# Patient Record
Sex: Male | Born: 2000
Health system: Southern US, Community
[De-identification: ages and names within clinical notes are randomized; demographics above are authoritative.]

## PROBLEM LIST (undated history)

## (undated) HISTORY — PX: OTHER SURGICAL HISTORY: SHX169

## (undated) HISTORY — PX: TONSILLECTOMY: SUR1361

---
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
## 2020-07-17 ENCOUNTER — Other Ambulatory Visit: Payer: Self-pay

## 2020-07-17 ENCOUNTER — Emergency Department
Admission: EM | Admit: 2020-07-17 | Discharge: 2020-07-17 | Disposition: A | Discharge status: Home / Self Care | Payer: No Typology Code available for payment source | Attending: Emergency Medicine | Admitting: Emergency Medicine

## 2020-07-17 DIAGNOSIS — H9202 Otalgia, left ear: Secondary | ICD-10-CM | POA: Diagnosis present

## 2020-07-17 DIAGNOSIS — H66002 Acute suppurative otitis media without spontaneous rupture of ear drum, left ear: Secondary | ICD-10-CM | POA: Insufficient documentation

## 2020-07-17 MED ORDER — AMOXICILLIN 500 MG PO CAPS
1000.0000 mg | ORAL_CAPSULE | Freq: Once | ORAL | Status: AC
Start: 2020-07-17 — End: 2020-07-17
  Administered 2020-07-17: 1000 mg via ORAL
  Filled 2020-07-17: qty 2

## 2020-07-17 MED ORDER — AMOXICILLIN 875 MG PO TABS
875.0000 mg | ORAL_TABLET | Freq: Two times a day (BID) | ORAL | 0 refills | Status: AC
Start: 2020-07-17 — End: 2020-07-27

## 2020-07-17 NOTE — ED Triage Notes (Signed)
Pt states this week has had cold symptoms. Pt states approx 2 hours pta began to experience left sided ear pain. Pt describes as "inside my head you know". Pt appears in no acute distress.

## 2020-07-18 NOTE — ED Provider Notes (Signed)
[facility] Emergency Department Provider Note  ____________________________________________   Event Date/Time   First MD Initiated Contact with Patient 07/17/20 2239     (approximate)  I have reviewed the triage vital signs and the nursing notes.   HISTORY  Chief Complaint Ear Pain  HPI Bruce Cochran is a 20 y.o. male who reports to the emergency department for evaluation of left ear pain.  The patient states that he has had cold symptoms over the last several days without known Covid exposure.  He had Covid tested earlier today and is awaiting the results of this.  He states that in the interim, he laid down to took a nap and he woke up several hours ago with pain in the left ear.  He denies any trauma or known injury.  Denies fevers or other systemic symptoms.         No past medical history on file.  Patient Active Problem List   Diagnosis Date Noted  . Verruca vulgaris 01/03/2019  . 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 left ankle surgery     podiatry on [place]  . TONSILLECTOMY      Prior to Admission medications   Medication Sig Start Date End Date Taking? Authorizing Provider  amoxicillin (AMOXIL) 875 MG tablet Take 1 tablet (875 mg total) by mouth 2 (two) times daily for 10 days. 07/17/20 07/27/20 Yes Lucy Chris, PA    Allergies Other  Family History  Problem Relation Age of Onset  . Thyroid disease Mother   . Hypertension Mother   . Allergies Mother   . Eczema Sister     Social History Social History   Tobacco Use  . Smoking status: Never Smoker  . Smokeless tobacco: Never Used  . Tobacco comment: previous vap from 2019 to 11/2018 quit   Substance Use Topics  . Alcohol use: No  . Drug use: No    Review of Systems Constitutional: No fever/chills Eyes: No visual changes. ENT: + sore throat, + left ear pain. Cardiovascular: Denies chest  pain. Respiratory: + Cough, denies shortness of breath. Gastrointestinal: No abdominal pain.  No nausea, no vomiting.  No diarrhea.  No constipation. Genitourinary: Negative for dysuria. Musculoskeletal: Negative for back pain. Skin: Negative for rash. Neurological: Negative for headaches, focal weakness or numbness. ____________________________________________   PHYSICAL EXAM:  VITAL SIGNS: ED Triage Vitals [07/17/20 2145]  Enc Vitals Group     BP (!) 141/75     Pulse Rate 98     Resp 20     Temp 98.7 F (37.1 C)     Temp Source Oral     SpO2 98 %     Weight 250 lb (113.4 kg)     Height 6' (1.829 m)     Head Circumference      Peak Flow      Pain Score 8     Pain Loc      Pain Edu?      Excl. in GC?    Constitutional: Alert and oriented. Well appearing and in no acute distress. Eyes: Conjunctivae are normal. PERRL. EOMI. Head: Atraumatic. Nose: Mild congestion/rhinnorhea. Mouth/Throat: Mucous membranes are moist.  Oropharynx erythematous without tonsillar enlargement or exudate. Ears: The left TM is bulging and erythematous, right TM is visualized and pearly gray without bulging. Neck: No stridor.   Lymphatic: Mild cervical lymphadenopathy Cardiovascular: Normal rate, regular rhythm. Grossly normal heart sounds.  Good peripheral circulation. Respiratory: Normal respiratory effort.  No retractions. Lungs CTAB. Gastrointestinal: Soft and nontender. No distention. No abdominal bruits. No CVA tenderness. Musculoskeletal: No lower extremity tenderness nor edema.  No joint effusions. Neurologic:  Normal speech and language. No gross focal neurologic deficits are appreciated. No gait instability. Skin:  Skin is warm, dry and intact. No rash noted. Psychiatric: Mood and affect are normal. Speech and behavior are normal.  ____________________________________________   INITIAL IMPRESSION / ASSESSMENT AND PLAN / ED COURSE  As part of my medical decision making, I reviewed  the following data within the electronic MEDICAL RECORD NUMBER History obtained from family and Nursing notes reviewed and incorporated        Patient is a 20 year old male who presents to the emergency department for evaluation of left ear pain after recent URI symptoms.  See HPI for further details.  On physical exam, the patient does have what appears to be a left ear infection given erythema and bulging.  There is no active drainage.  Patient is afebrile.  He does also have URI symptoms but he is already been Covid tested and is waiting these results in the community.  Patient will be started on amoxicillin antibiotic for treatment of left otitis media.  Patient is amenable with this plan is stable this time for outpatient therapy.      ____________________________________________   FINAL CLINICAL IMPRESSION(S) / ED DIAGNOSES  Final diagnoses:  Non-recurrent acute suppurative otitis media of left ear without spontaneous rupture of tympanic membrane     ED Discharge Orders         Ordered    amoxicillin (AMOXIL) 875 MG tablet  2 times daily        07/17/20 2306          *Please note:  ROMALDO SAVILLE was evaluated in Emergency Department on 07/18/2020 for the symptoms described in the history of present illness. He was evaluated in the context of the global COVID-19 pandemic, which necessitated consideration that the patient might be at risk for infection with the SARS-CoV-2 virus that causes COVID-19. Institutional protocols and algorithms that pertain to the evaluation of patients at risk for COVID-19 are in a state of rapid change based on information released by regulatory bodies including the CDC and federal and state organizations. These policies and algorithms were followed during the patient's care in the ED.  Some ED evaluations and interventions may be delayed as a result of limited staffing during and the pandemic.*   Note:  This document was prepared using Dragon voice  recognition software and may include unintentional dictation errors.    Lucy Chris, PA 07/18/20 0006    [place], MD 07/20/20 [phone]
